# Patient Record
Sex: Female | Born: 1990 | Race: White | Hispanic: No | Marital: Married | Smoking: Former smoker
Health system: Southern US, Community
[De-identification: ages and names within clinical notes are randomized; demographics above are authoritative.]

## PROBLEM LIST (undated history)

## (undated) DIAGNOSIS — K2970 Gastritis, unspecified, without bleeding: Secondary | ICD-10-CM

## (undated) DIAGNOSIS — F32A Depression, unspecified: Secondary | ICD-10-CM

## (undated) DIAGNOSIS — R519 Headache, unspecified: Secondary | ICD-10-CM

## (undated) HISTORY — DX: Depression, unspecified: F32.A

## (undated) HISTORY — DX: Headache, unspecified: R51.9

## (undated) HISTORY — DX: Gastritis, unspecified, without bleeding: K29.70

---

## 2021-03-12 HISTORY — PX: LAPAROSCOPIC GASTRIC SLEEVE RESECTION: SHX5895

## 2021-05-28 ENCOUNTER — Encounter: Payer: Self-pay | Admitting: *Deleted

## 2021-05-29 ENCOUNTER — Ambulatory Visit: Payer: BC Managed Care – PPO | Admitting: Psychiatry

## 2021-05-29 ENCOUNTER — Encounter: Payer: Self-pay | Admitting: Psychiatry

## 2021-05-29 VITALS — BP 129/81 | HR 72 | Ht 67.0 in | Wt 272.2 lb

## 2021-05-29 DIAGNOSIS — G4489 Other headache syndrome: Secondary | ICD-10-CM

## 2021-05-29 DIAGNOSIS — H4710 Unspecified papilledema: Secondary | ICD-10-CM

## 2021-05-29 NOTE — Progress Notes (Signed)
[facility]  PATIENT: Kelsey Carson DOB: [birth year]  REFERRING CLINICIAN: Marzella Schlein., MD HISTORY FROM: self REASON FOR VISIT: papilledema   HISTORICAL  CHIEF COMPLAINT:  Chief Complaint  Patient presents with   New Patient (Initial Visit)    Rm 1,long standing headaches.  Had eye exam (Dr. Lucretia Roers, [facility]) ? Mild disc edema     HISTORY OF PRESENT ILLNESS:  The patient presents for evaluation of papilledema seen on routine eye exam. She notes she has had blurred vision recently, but also has been wearing the wrong prescription for over a year. Did have a trial pair of glasses with updated prescription which gave her clear vision.  States she had headaches prior her gastric sleeve in September 2022. These were described as dull headaches which would last for 2-3 days at a time. They were worse around her periods. Bad headaches would occur 2-3 times per year. Has lost ~40 lbs since her gastric sleeve and has not had significant headaches since. Denies current positional headaches, pulsatile tinnitus, double vision, or blacking out of vision.  OTHER MEDICAL CONDITIONS: s/p gastric sleeve   REVIEW OF SYSTEMS: Full 14 system review of systems performed and negative with exception of: s/p gastric sleeve  ALLERGIES: Allergies  Allergen Reactions   Cefpodoxime Rash    HOME MEDICATIONS: Outpatient Medications Prior to Visit  Medication Sig Dispense Refill   levonorgestrel-ethinyl estradiol (SEASONALE) 0.15-0.03 MG tablet Take 1 tablet by mouth daily.     omeprazole (PRILOSEC) 40 MG capsule omeprazole 40 mg capsule,delayed release  Take 1 capsule every day by oral route.     ondansetron (ZOFRAN-ODT) 4 MG disintegrating tablet ondansetron 4 mg disintegrating tablet     No facility-administered medications prior to visit.    PAST MEDICAL HISTORY: Past Medical History:  Diagnosis Date   Depression    Gastritis    Headache     PAST SURGICAL  HISTORY: Past Surgical History:  Procedure Laterality Date   LAPAROSCOPIC GASTRIC SLEEVE RESECTION  03/2021    FAMILY HISTORY: Family History  Problem Relation Age of Onset   Cancer Paternal Grandmother    Migraines Neg Hx     SOCIAL HISTORY: Social History   Socioeconomic History   Marital status: Married    Spouse name: Not on file   Number of children: Not on file   Years of education: Not on file   Highest education level: Not on file  Occupational History   Not on file  Tobacco Use   Smoking status: Former    Types: Cigarettes    Quit date: 07/12/2016    Years since quitting: 4.8   Smokeless tobacco: Never  Vaping Use   Vaping Use: Former  Substance and Sexual Activity   Alcohol use: Not Currently   Drug use: Never   Sexual activity: Not on file  Other Topics Concern   Not on file  Social History Narrative   Caffeine- rare these days.  EDucation: Cabin crew.  Working: Facilities manager.    Social Determinants of Health   Financial Resource Strain: Not on file  Food Insecurity: Not on file  Transportation Needs: Not on file  Physical Activity: Not on file  Stress: Not on file  Social Connections: Not on file  Intimate Partner Violence: Not on file     PHYSICAL EXAM  GENERAL EXAM/CONSTITUTIONAL: Vitals:  Vitals:   05/29/21 1109  BP: 129/81  Pulse: 72  Weight: 272 lb 3.2 oz (123.5 kg)  Height: 5\' 7"  (1.702 m)   Body mass index is 42.63 kg/m. Wt Readings from Last 3 Encounters:  05/29/21 272 lb 3.2 oz (123.5 kg)   Patient is in no distress; well developed, nourished and groomed; neck is supple  CARDIOVASCULAR: Examination of peripheral vascular system by observation and palpation is normal  EYES: Pupils round and reactive to light, Visual fields full to confrontation, Extraocular movements intact  MUSCULOSKELETAL: Gait, strength, tone, movements noted in Neurologic exam below  NEUROLOGIC: MENTAL STATUS:  awake, alert, oriented  to person, place and time recent and remote memory intact normal attention and concentration language fluent, comprehension intact, naming intact fund of knowledge appropriate  CRANIAL NERVE:  2nd - mild blurring of disc margins OD 2nd, 3rd, 4th, 6th - pupils equal and reactive to light, visual fields full to confrontation, extraocular muscles intact, no nystagmus 5th - facial sensation symmetric 7th - facial strength symmetric 8th - hearing intact 9th - palate elevates symmetrically, uvula midline 11th - shoulder shrug symmetric 12th - tongue protrusion midline  MOTOR:  normal bulk and tone, full strength in the BUE, BLE  SENSORY:  normal and symmetric to light touch all 4 extremities  COORDINATION:  finger-nose-finger intact  REFLEXES:  deep tendon reflexes present and symmetric  GAIT/STATION:  normal     DIAGNOSTIC DATA (LABS, IMAGING, TESTING) - I reviewed patient records, labs, notes, testing and imaging myself where available.  03/10/21 CBC wnl  ASSESSMENT AND PLAN  30 y.o. year old female with a history of gastric sleeve 03/2021 who presents for evaluation of papilledema seen on routine eye exam. She endorses blurred vision though this appears to be due to out of date glasses prescription as she did have clearing of her vision when she tried her new sample lenses. Otherwise she is asymptomatic. Will order MRI/MRV to rule out structural causes of increased intracranial pressure including CVST as she is currently taking OCPs. If imaging is normal will plan for lumbar puncture to measure opening pressure.   1. Other headache syndrome       PLAN: -MRI, MRV -Will plan for LP to assess for IIH if imaging is unrevealing   Orders Placed This Encounter  Procedures   MR BRAIN W WO CONTRAST   MR MRV HEAD WO CM    No orders of the defined types were placed in this encounter.   Return in about 3 months (around 08/29/2021).    08/31/2021,  MD 05/29/21 12:05 PM  I spent an average of 39 minutes chart reviewing and counseling the patient, with at least 50% of the time face to face with the patient. Discussed differential diagnosis and workup of increased intracranial pressure.  [facility] [address] [place], [place] [phone]

## 2021-05-29 NOTE — Patient Instructions (Signed)
MRI/MRV May need spinal tap after imaging to rule out idiopathic intracranial hypertension (IIH)

## 2021-06-01 ENCOUNTER — Telehealth: Payer: Self-pay | Admitting: Psychiatry

## 2021-06-01 NOTE — Telephone Encounter (Signed)
Obtained PA from [facility] for MRI & MRV. PA # [phone] (06/01/21- 06/30/21).   I called the patient but reached voicemail, did not leave message because the name on the recording did not match the patient's. I intended on asking patient where she would like exams done d/t her [place] address.   I will send the exam orders to [facility] @ [address], [place], [place] [address]. Phone: [phone]. If patient does not want the exams done there she can give [place] a call to let us know.

## 2021-06-24 ENCOUNTER — Other Ambulatory Visit: Payer: Self-pay | Admitting: Psychiatry

## 2021-06-24 ENCOUNTER — Telehealth: Payer: Self-pay | Admitting: *Deleted

## 2021-06-24 DIAGNOSIS — H4710 Unspecified papilledema: Secondary | ICD-10-CM

## 2021-06-24 NOTE — Telephone Encounter (Signed)
Received fax from [facility], [place], [place] re: MRI brain and MRV head reports. Placed on Dr Quentin Mulling desk for review.

## 2021-06-24 NOTE — Telephone Encounter (Signed)
Spoke with patient MRI/MRV shows narrowing of one of her veins, which can be seen with idiopathic intracranial hypertension. I've placed an order for a spinal tap to measure her opening pressure.  Advised she'll get a call to schedule the LP. Answered her questions to her stated satisfaction. Patient verbalized understanding, appreciation.

## 2021-06-24 NOTE — Telephone Encounter (Signed)
MRI/MRV shows narrowing of one of her veins, which can be seen with idiopathic intracranial hypertension. I've placed an order for a spinal tap to measure her opening pressure

## 2021-06-25 ENCOUNTER — Telehealth: Payer: Self-pay | Admitting: Psychiatry

## 2021-06-25 NOTE — Telephone Encounter (Signed)
Order sent to GI for Kelsey Carson she will reach out to the patient to schedule.

## 2021-07-12 HISTORY — PX: CHOLECYSTECTOMY: SHX55

## 2021-07-16 ENCOUNTER — Ambulatory Visit
Admission: RE | Admit: 2021-07-16 | Discharge: 2021-07-16 | Disposition: A | Discharge status: Home / Self Care | Payer: BC Managed Care – PPO | Source: Ambulatory Visit | Attending: Psychiatry | Admitting: Psychiatry

## 2021-07-16 ENCOUNTER — Other Ambulatory Visit: Payer: Self-pay

## 2021-07-16 ENCOUNTER — Other Ambulatory Visit: Payer: Self-pay | Admitting: Psychiatry

## 2021-07-16 ENCOUNTER — Telehealth: Payer: Self-pay

## 2021-07-16 VITALS — BP 126/71 | HR 78

## 2021-07-16 DIAGNOSIS — H4710 Unspecified papilledema: Secondary | ICD-10-CM

## 2021-07-16 LAB — CSF CELL COUNT WITH DIFFERENTIAL
RBC Count, CSF: 1000 cells/uL — ABNORMAL HIGH
WBC, CSF: 0 cells/uL (ref 0–5)

## 2021-07-16 LAB — PROTEIN, CSF: Total Protein, CSF: 49 mg/dL — ABNORMAL HIGH (ref 15–45)

## 2021-07-16 LAB — GLUCOSE, CSF: Glucose, CSF: 55 mg/dL (ref 40–80)

## 2021-07-16 MED ORDER — ACETAZOLAMIDE 250 MG PO TABS: 250.0000 mg | ORAL_TABLET | Freq: Two times a day (BID) | ORAL | 2 refills | Status: DC

## 2021-07-16 NOTE — Discharge Instructions (Signed)

## 2021-07-16 NOTE — Telephone Encounter (Signed)
Pt returned call, went over results with her. She did ask if tab can be crushed as she had weight loss surgery and can't take pills whole. If not she will need another form  Please advise

## 2021-07-16 NOTE — Telephone Encounter (Signed)
-----   Message from Ocie Doyne, MD sent at 07/16/2021  4:06 PM EST ----- LP shows an elevated opening pressure, consistent with IIH. I'll send in a prescription for Diamox to her pharmacy to help lower her pressure.

## 2021-07-16 NOTE — Telephone Encounter (Signed)
Contacted pt, Kelsey Carson per DPR, informing her LP shows an elevated opening pressure, consistent with IIH (idiopathic intracranial hypertension). Dr Delena Bali sent in a prescription for Diamox to her pharmacy to help lower her pressure. Advised to call the office back with questions.

## 2021-07-17 ENCOUNTER — Other Ambulatory Visit: Payer: Self-pay | Admitting: Psychiatry

## 2021-07-17 MED ORDER — ACETAZOLAMIDE ORAL SUSPENSION 25 MG/ML: 250.0000 mg | Freq: Two times a day (BID) | ORAL | 2 refills | Status: DC

## 2021-07-17 NOTE — Telephone Encounter (Signed)
Diamox Rx printed; I faxed to patient's pharmacy. Received confirmation.

## 2021-07-17 NOTE — Telephone Encounter (Signed)
Yes, she can crush the tablets

## 2021-07-17 NOTE — Telephone Encounter (Signed)
Yes, I'll send in an Rx for a liquid version of the Diamox to her pharmacy

## 2021-07-17 NOTE — Telephone Encounter (Signed)
Called patient and informed her of new Rx sent in for liquid for per Dr Delena Bali. Patient verbalized understanding, appreciation.

## 2021-07-17 NOTE — Telephone Encounter (Signed)
Pt ask if there a liquid form. Crushing tablets may upset the stomach.

## 2021-07-20 ENCOUNTER — Other Ambulatory Visit: Payer: Self-pay | Admitting: Psychiatry

## 2021-07-20 ENCOUNTER — Telehealth: Payer: Self-pay | Admitting: Psychiatry

## 2021-07-20 DIAGNOSIS — G971 Other reaction to spinal and lumbar puncture: Secondary | ICD-10-CM

## 2021-07-20 NOTE — Telephone Encounter (Signed)
I called pt back and relayed Dr. Billey Gosling is agreeable to blood patch.  Order has been placed.  Pt will call [facility] later this afternoon and will try and get appt set up for tomorrow or Wed.  Pt will let us know if she has any difficulty.

## 2021-07-20 NOTE — Telephone Encounter (Signed)
I called pt. Pt reports since LP she has been struggling with a h/a. Pt reports after LP on 07/16/20 she followed protocol and laid flat for 12-18 hours. On 06/16/21 she returned to work and h/a started.  Pt has upped her caffeine intake and was able to start the oral pill form of diamoax but no improvement has been noted. Reports are worse when she is up walking around; feels slightly better when laying flat but not resolved.  Wanted to know if she should have a blood patch?

## 2021-07-20 NOTE — Telephone Encounter (Signed)
Pt is requesting a Blood Patch as a result of complications from her LP, please call, pt asked it be noted that she is 3 hours away.

## 2021-07-20 NOTE — Telephone Encounter (Signed)
I'll put in an order for a blood patch for her, thanks

## 2021-07-21 ENCOUNTER — Ambulatory Visit
Admission: RE | Admit: 2021-07-21 | Discharge: 2021-07-21 | Disposition: A | Discharge status: Home / Self Care | Payer: BC Managed Care – PPO | Source: Ambulatory Visit | Attending: Psychiatry | Admitting: Psychiatry

## 2021-07-21 DIAGNOSIS — G971 Other reaction to spinal and lumbar puncture: Secondary | ICD-10-CM

## 2021-07-21 MED ORDER — IOPAMIDOL (ISOVUE-M 200) INJECTION 41%
1.0000 mL | Freq: Once | INTRAMUSCULAR | Status: AC
  Administered 2021-07-21: 1 mL via EPIDURAL

## 2021-07-21 NOTE — Discharge Instructions (Signed)

## 2021-07-25 ENCOUNTER — Telehealth: Payer: Self-pay | Admitting: Psychiatry

## 2021-07-25 DIAGNOSIS — R7401 Elevation of levels of liver transaminase levels: Secondary | ICD-10-CM

## 2021-07-25 NOTE — Telephone Encounter (Signed)
Received a call from patient who noted she went to the ED for lack of appetite and dehydration. LFTs were elevated in the ED. She stopped the Diamox 2 days ago due to lack of appetite. Discussed how Diamox can sometimes cause transaminitis, and will discontinue this medication. Of note, she had started an antibiotic around the same time she had started Diamox.  Will plan for repeat CMP next week. She would like blood work order faxed to [facility] (fax # [phone]). If LFTs trending down will plan to start furosemide.  Kelsey Carson 07/25/21 5:47 PM

## 2021-07-30 NOTE — Telephone Encounter (Signed)
Original order was for [facility].placed new order for [facility], printed and faxed to [facility]. Received confirmation.

## 2021-07-30 NOTE — Addendum Note (Signed)
Addended by: [place] Pink on: 07/30/2021 10:17 AM   Modules accepted: Orders

## 2021-08-06 ENCOUNTER — Other Ambulatory Visit: Payer: Self-pay | Admitting: Psychiatry

## 2021-08-06 ENCOUNTER — Telehealth: Payer: Self-pay | Admitting: Psychiatry

## 2021-08-06 MED ORDER — FUROSEMIDE 20 MG PO TABS: 20.0000 mg | ORAL_TABLET | Freq: Every day | ORAL | 3 refills | Status: DC

## 2021-08-06 NOTE — Telephone Encounter (Signed)
Liver enzymes are still elevated, but are trending down. She can start furosemide for her IIH. I'll send a prescription to her pharmacy

## 2021-08-06 NOTE — Telephone Encounter (Signed)
Patient had repeat labs done on 07/31/21. Message to Dr Delena Bali to review labs.

## 2021-08-06 NOTE — Telephone Encounter (Signed)
Patient called back stating she's had a lot going on since she saw Dr Delena Bali in Nov. She stopped diamox around jan 13th due to elevated liver enzymes.  During this time she began having gall bladder issues.  On Jan  24th her gallbladder was removed. She wonders if lab abnormalities were due to gall bladder issues instead of diamox. She is hesitant to start furosemide, wonders if diamox was best medication for her. She expressed concern and would like Dr Delena Bali to call and discuss with her. I advised will send to Dr Delena Bali and request she call patient. Patient verbalized understanding, appreciation.

## 2021-08-06 NOTE — Telephone Encounter (Signed)
Called patient and informed her of Dr Quentin Mulling message and new Rx. Answered her question, advised she call after starting medicine if she has any other questions.  she verbalized understanding, appreciation.

## 2021-08-06 NOTE — Telephone Encounter (Signed)
Called patient to discuss lab results. After ED visit she was found to have biliary sludge which was felt to be causing her elevated LFTs. Had her gallbladder removed earlier this week and is starting to feel better. She is getting repeat CMP next week and will fax these results to me. Requested to stay off of medication this week if possible. As she is not having severe headaches or significant vision changes, will hold off on restarting medication until her repeat blood work comes back. At that time would consider restarting Diamox as this is now considered less likely to have been the cause for her transaminitis.  Kelsey Carson 08/06/21 4:44 PM

## 2021-08-06 NOTE — Telephone Encounter (Signed)
Gave her a call, thanks

## 2021-09-02 ENCOUNTER — Ambulatory Visit: Payer: BC Managed Care – PPO | Admitting: Psychiatry

## 2021-09-03 ENCOUNTER — Ambulatory Visit: Payer: BC Managed Care – PPO | Admitting: Psychiatry

## 2021-10-07 NOTE — Progress Notes (Signed)
? ?  CC:  headaches ? ?Follow-up Visit ? ?Last visit: 05/29/21 ? ?Brief HPI: ?31 year old female with a history of gastric sleeve who follows in clinic for papilledema. ? ?At her last visit MRI and MRV were ordered. ? ?Interval History: ?MRI was unremarkable. MRV showed venous stenosis without evidence of thrombosis. LP was done which showed an opening pressure of 21. She developed post-LP headache and underwent blood patch which did help reduce her headache. She was started on Diamox, however soon after this she developed elevated liver enzymes. Initially this was thought to be secondary to Diamox and it was discontinued. She was later found to have biliary sludge and underwent gallbladder removal. ? ?Currently she is headache free and vision is stable. She saw her ophthalmologist yesterday who noted her papilledema had resolved. Tolerates Diamox 250 mg BID relatively well although it does cause paresthesias. ? ? ?Headache days per month: 0 ?Headache free days per month: 30 ? ?Current Headache Regimen: ?Diamox 250 mg BID ? ? ?Physical Exam:  ? ?Vital Signs: ?BP 118/72   Pulse 80   Ht 5\' 7"  (1.702 m)   Wt 221 lb 9.6 oz (100.5 kg)   BMI 34.71 kg/m?  ?GENERAL:  well appearing, in no acute distress, alert  ?SKIN:  Color, texture, turgor normal. No rashes or lesions ?HEAD:  Normocephalic/atraumatic. ?RESP: normal respiratory effort ?MSK:  No gross joint deformities.  ? ?NEUROLOGICAL: ?Mental Status: Alert, oriented to person, place and time, Follows commands, and Speech fluent and appropriate. ?Cranial Nerves: PERRL, optic discs sharp OU, face symmetric, no dysarthria, hearing grossly intact ?Motor: moves all extremities equally ?Gait: normal-based. ? ?IMPRESSION: ?31 year old female with a history of gastric sleep who follows in clinic for IIH. Her papilledema has resolved on Diamox 250 mg BID. Will decrease dose to 250 mg QHS. If she continues to do well at next follow up with plan to stop the medication. Discussed  that she can take over the counter potassium if needed to help with bothersome paresthesias. ? ?PLAN: ?-Decrease Diamox to 250 mg QHS ?-CMP today ?-Follow up with ophthalmology scheduled in 6 months ?-next steps: will plan to stop Diamox if no papilledema at next follow up ? ? ?Follow-up: 4 months ? ?I spent a total of 23 minutes on the date of the service.  Discussed medication side effects, adverse reactions and drug interactions. Written educational materials and patient instructions outlining all of the above were given. ? ?Genia Harold, MD ?10/08/21 ?2:53 PM ? ?

## 2021-10-08 ENCOUNTER — Encounter: Payer: Self-pay | Admitting: Psychiatry

## 2021-10-08 ENCOUNTER — Ambulatory Visit: Payer: BC Managed Care – PPO | Admitting: Psychiatry

## 2021-10-08 VITALS — BP 118/72 | HR 80 | Ht 67.0 in | Wt 221.6 lb

## 2021-10-08 DIAGNOSIS — R7401 Elevation of levels of liver transaminase levels: Secondary | ICD-10-CM

## 2021-10-08 DIAGNOSIS — G932 Benign intracranial hypertension: Secondary | ICD-10-CM

## 2021-10-08 NOTE — Patient Instructions (Addendum)
You can try potassium 99 mg daily to help with tingling in the fingers and toes ? ?Decrease diamox to 250 mg daily ? ?

## 2021-10-09 LAB — COMPREHENSIVE METABOLIC PANEL
ALT: 197 IU/L — ABNORMAL HIGH (ref 0–32)
AST: 138 IU/L — ABNORMAL HIGH (ref 0–40)
Albumin/Globulin Ratio: 1.5 (ref 1.2–2.2)
Albumin: 4.6 g/dL (ref 3.9–5.0)
Alkaline Phosphatase: 111 IU/L (ref 44–121)
BUN/Creatinine Ratio: 13 (ref 9–23)
BUN: 9 mg/dL (ref 6–20)
Bilirubin Total: 0.4 mg/dL (ref 0.0–1.2)
CO2: 17 mmol/L — ABNORMAL LOW (ref 20–29)
Calcium: 9.4 mg/dL (ref 8.7–10.2)
Chloride: 107 mmol/L — ABNORMAL HIGH (ref 96–106)
Creatinine, Ser: 0.71 mg/dL (ref 0.57–1.00)
Globulin, Total: 3 g/dL (ref 1.5–4.5)
Glucose: 81 mg/dL (ref 70–99)
Potassium: 3.9 mmol/L (ref 3.5–5.2)
Sodium: 140 mmol/L (ref 134–144)
Total Protein: 7.6 g/dL (ref 6.0–8.5)
eGFR: 118 mL/min/{1.73_m2} (ref >59)

## 2021-10-12 ENCOUNTER — Telehealth: Payer: Self-pay

## 2021-10-12 NOTE — Telephone Encounter (Signed)
Contacted pt, informed her of results. Advised to call office back with questions as she had none at this time and was apperceive.  ?

## 2021-10-12 NOTE — Telephone Encounter (Signed)
-----   Message from Ocie Doyne, MD sent at 10/09/2021 10:10 AM EDT ----- ?Liver enzymes are still elevated, but they are much lower than they were in January. OK to stay on the Diamox 250 mg daily for now with the plan to consider weaning off after her next appointment ?

## 2021-12-13 ENCOUNTER — Other Ambulatory Visit: Payer: Self-pay | Admitting: Psychiatry

## 2022-03-04 ENCOUNTER — Ambulatory Visit: Payer: BC Managed Care – PPO | Admitting: Psychiatry

## 2022-03-04 VITALS — BP 121/71 | HR 85 | Ht 67.0 in | Wt 193.1 lb

## 2022-03-04 DIAGNOSIS — G932 Benign intracranial hypertension: Secondary | ICD-10-CM | POA: Diagnosis not present

## 2022-03-04 NOTE — Progress Notes (Signed)
   CC:  headaches  Follow-up Visit  Last visit: 10/08/21  Brief HPI: 31 year old female with a history of gastric sleeve, IIH who follows in clinic for papilledema. LP showed an opening pressure of 21. Had improvement with LP so she was started on Diamox. Papilledema had resolved at ophthalmology visit in March 2023.  She was hesitant to stop Diamox at her last visit. She was continued on 250 mg QHS with the plan to stop it after her next ophthalmology exam.  Interval History: She has not had any significant headaches or vision changes since her last visit. Saw ophthalmology today who noted stable discs without papilledema.  Current Headache Regimen: Preventative: diamox 250 mg QHS  Prior Therapies                                  Diamox  Physical Exam:   Vital Signs: BP 121/71   Pulse 85   Ht 5\' 7"  (1.702 m)   Wt 193 lb 2 oz (87.6 kg)   BMI 30.25 kg/m  GENERAL:  well appearing, in no acute distress, alert  SKIN:  Color, texture, turgor normal. No rashes or lesions HEAD:  Normocephalic/atraumatic. RESP: normal respiratory effort MSK:  No gross joint deformities.   NEUROLOGICAL: Mental Status: Alert, oriented to person, place and time, Follows commands, and Speech fluent and appropriate. Cranial Nerves: PERRL, optic discs sharp OU, face symmetric, no dysarthria, hearing grossly intact Motor: moves all extremities equally Gait: normal-based.  IMPRESSION: 31 year old female with a history of gastric sleeve who presents for follow up of IIH. Ophthalmology exam today was stable with no papilledema. Will stop Diamox. Discussed importance of weight management to help prevent recurrence of IIH.   PLAN: -Stop Diamox -Return to clinic if she develops worsening headaches or vision changes   Follow-up: as needed  I spent a total of 16 minutes on the date of the service. Headache education was done. Discussed medication side effects, adverse reactions and drug interactions.  Written educational materials and patient instructions outlining all of the above were given.  38, MD 03/04/22 2:40 PM

## 2022-03-04 NOTE — Patient Instructions (Signed)
Return as needed. Let me know if you develop worsening vision, positional headaches (worse with lying flat or bending over), or whooshing sounds in your ear

## 2022-12-04 IMAGING — XA Imaging study
2 series · 2 of 2 positions shown · non-contrast
Comparison: none

CLINICAL DATA: Spinal headache after lumbar puncture.

[Series 5: ortho adipose · 1 of 1 slices shown (1 of 2)]
[im 1/1]
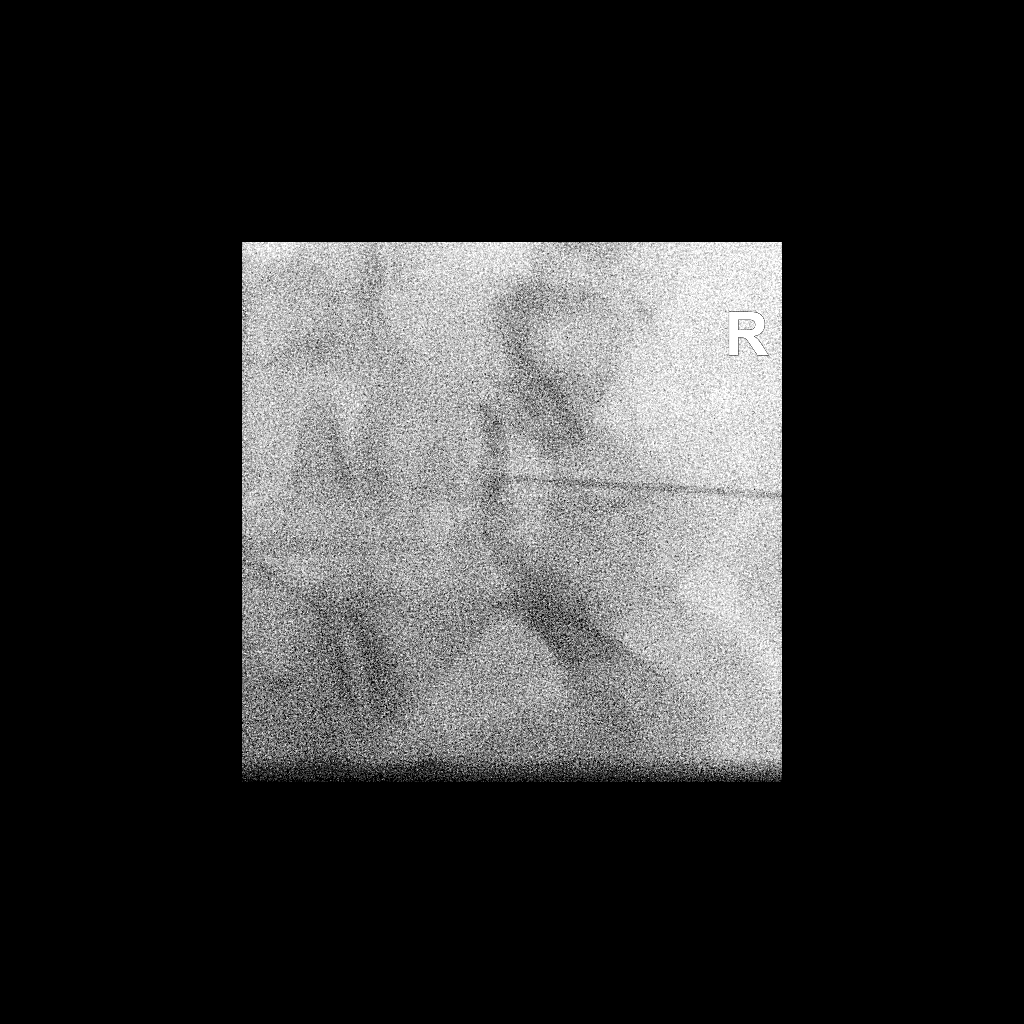

[Series 6: ortho adipose · 1 of 1 slices shown (2 of 2)]
[im 1/1]
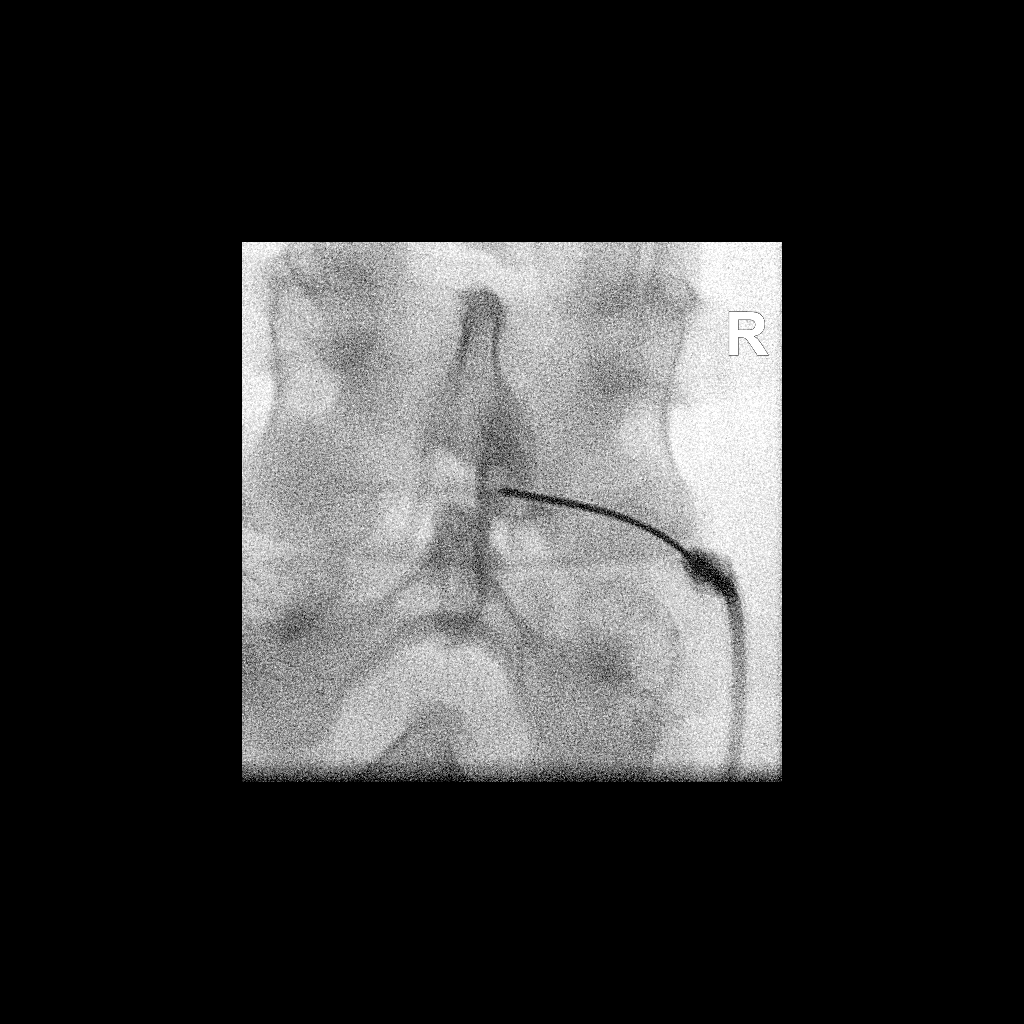

[2 of 2 positions shown; findings below may reference images not displayed]

FLUOROSCOPY TIME:  Radiation Exposure Index (as provided by the
fluoroscopic device): 13.3 mGy

Fluoroscopy Time:  1 minute, 9 seconds

Number of Acquired Images:  0

PROCEDURE:
LUMBAR EPIDURAL BLOOD PATCH INJECTION

After a thorough discussion of risks and benefits of the procedure,
written and verbal consent was obtained.

Prior to the procedure, 20 ml of the patient's blood was harvested
using stringent sterile technique.

An interlaminar approach was performed at L4-L5 on the right. Under
stringent sterile technique, overlying skin was cleansed with
betadine soap and anesthetized with 1% lidocaine without
epinephrine. A 6 inch 20 gauge needle was advanced using
loss-of-resistance technique.

DIAGNOSTIC EPIDURAL INJECTION

Injection of Isovue-M 200 shows a good epidural pattern with spread
above and below the level of needle placement, primarily on the side
of needle placement. No vascular or subarachnoid opacification is
seen.

THERAPEUTIC EPIDURAL INJECTION

15 ml of the patient's blood was injected into the epidural space at
the site of prior lumbar puncture.
IMPRESSION: Technically successful lumbar blood patch at L4-L5.
